# Patient Record
Sex: Female | Born: 2000 | Race: White | Hispanic: No | Marital: Single | Smoking: Never smoker
Health system: Southern US, Community
[De-identification: ages and names within clinical notes are randomized; demographics above are authoritative.]

## PROBLEM LIST (undated history)

## (undated) DIAGNOSIS — L940 Localized scleroderma [morphea]: Secondary | ICD-10-CM

## (undated) DIAGNOSIS — M069 Rheumatoid arthritis, unspecified: Secondary | ICD-10-CM

## (undated) HISTORY — PX: EYE MUSCLE SURGERY: SHX370

---
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
## 2020-08-15 DIAGNOSIS — M0579 Rheumatoid arthritis with rheumatoid factor of multiple sites without organ or systems involvement: Secondary | ICD-10-CM | POA: Diagnosis present

## 2020-10-04 ENCOUNTER — Other Ambulatory Visit: Payer: Self-pay

## 2020-10-04 ENCOUNTER — Encounter: Payer: Self-pay | Admitting: Intensive Care

## 2020-10-04 ENCOUNTER — Emergency Department
Admission: EM | Admit: 2020-10-04 | Discharge: 2020-10-04 | Disposition: A | Discharge status: Home / Self Care | Payer: BC Managed Care – PPO | Source: Home / Self Care | Attending: Emergency Medicine | Admitting: Emergency Medicine

## 2020-10-04 DIAGNOSIS — R1310 Dysphagia, unspecified: Secondary | ICD-10-CM | POA: Insufficient documentation

## 2020-10-04 DIAGNOSIS — T783XXA Angioneurotic edema, initial encounter: Secondary | ICD-10-CM | POA: Diagnosis not present

## 2020-10-04 DIAGNOSIS — R0789 Other chest pain: Secondary | ICD-10-CM | POA: Insufficient documentation

## 2020-10-04 DIAGNOSIS — L509 Urticaria, unspecified: Secondary | ICD-10-CM | POA: Insufficient documentation

## 2020-10-04 HISTORY — DX: Rheumatoid arthritis, unspecified: M06.9

## 2020-10-04 HISTORY — DX: Localized scleroderma (morphea): L94.0

## 2020-10-04 MED ORDER — SODIUM CHLORIDE 0.9 % IV BOLUS
1000.0000 mL | Freq: Once | INTRAVENOUS | Status: AC
  Administered 2020-10-04: 1000 mL via INTRAVENOUS

## 2020-10-04 MED ORDER — DIPHENHYDRAMINE HCL 50 MG/ML IJ SOLN
25.0000 mg | Freq: Once | INTRAMUSCULAR | Status: AC
  Administered 2020-10-04: 25 mg via INTRAVENOUS
  Filled 2020-10-04: qty 1

## 2020-10-04 MED ORDER — FAMOTIDINE IN NACL 20-0.9 MG/50ML-% IV SOLN
20.0000 mg | Freq: Once | INTRAVENOUS | Status: AC
  Administered 2020-10-04: 20 mg via INTRAVENOUS
  Filled 2020-10-04: qty 50

## 2020-10-04 MED ORDER — FAMOTIDINE 20 MG PO TABS: 20.0000 mg | ORAL_TABLET | Freq: Two times a day (BID) | ORAL | 0 refills | Status: AC

## 2020-10-04 MED ORDER — HYDROXYZINE PAMOATE 25 MG PO CAPS: 25.0000 mg | ORAL_CAPSULE | Freq: Three times a day (TID) | ORAL | 0 refills | Status: AC | PRN

## 2020-10-04 MED ORDER — METHYLPREDNISOLONE SODIUM SUCC 125 MG IJ SOLR
125.0000 mg | Freq: Once | INTRAMUSCULAR | Status: AC
  Administered 2020-10-04: 125 mg via INTRAVENOUS
  Filled 2020-10-04: qty 2

## 2020-10-04 NOTE — ED Notes (Signed)
DC instructions and medications reviewed by RN with pt and visitor, denied needs or concerns. AOx4, talking in full sentences with regular and unlabored breathing. Ambulated out of ED with steady gait

## 2020-10-04 NOTE — ED Notes (Signed)
Assumed care of pt at 2300. Pt reports rash is significantly improved from arrival, no longer having trouble swallowing.  Managing secretions well. Rash noted to posterior neck/hair line at this time. AO x4, talking in full sentences with regular and unlabored breathing.

## 2020-10-04 NOTE — ED Triage Notes (Signed)
Patient presents with rash and whelps from head to toes. Hands and feet swelling. Patient c/o chest tightness and some difficulty swallowing. Urgent care gave steroids yesterday. Patient reports symptoms worse today

## 2020-10-04 NOTE — ED Provider Notes (Signed)
[facility] Emergency Department Provider Note  ____________________________________________   Event Date/Time   First MD Initiated Contact with Patient 10/04/20 1823     (approximate)  I have reviewed the triage vital signs and the nursing notes.   HISTORY  Chief Complaint Allergic Reaction, Chest Pain, and Dysphagia  HPI Brandy Herring is a 20 y.o. female with the below medical history, presents herself to the ED for evaluation of ongoing allergic reaction.  She describes itchy rash and welts from head to toe.  She also notes some chest tightness a sense of difficulty swallowing.  She presented to a local urgent care center yesterday, and was given a course of oral steroids.  She describes no improvement in her symptoms today.  She notes continued rash as well as some joint tightness and swelling.  She has continued with the steroid as prescribed.  She denies any swelling of her lips, tongue, or throat.  She has been able to control oral secretions, and denies any syncope.     Past Medical History:  Diagnosis Date  . Morphea   . RA (rheumatoid arthritis) [facility])     Patient Active Problem List   Diagnosis Date Noted  . Generalized rash 10/05/2020  . Chest pain 10/05/2020  . SOB (shortness of breath) 10/05/2020  . Urticaria 10/05/2020  . Hives 10/05/2020  . Abdominal pain 10/05/2020  . Rheumatoid arthritis involving multiple sites with positive rheumatoid factor (HCC) 08/15/2020  . Mixed obsessional thoughts and acts 04/25/2019  . Mild episode of recurrent major depressive disorder (HCC) 04/25/2019  . Generalized anxiety disorder 04/25/2019  . Attention deficit hyperactivity disorder (ADHD), combined type 04/25/2019    Past Surgical History:  Procedure Laterality Date  . EYE MUSCLE SURGERY     For her strabismus.    Prior to Admission medications   Medication Sig Start Date End Date Taking? Authorizing Provider  famotidine (PEPCID) 20 MG tablet  Take 1 tablet (20 mg total) by mouth 2 (two) times daily for 10 days. 10/04/20 10/14/20 Yes Shailynn Fong, Charlesetta Ivory, PA-C  hydrOXYzine (VISTARIL) 25 MG capsule Take 1 capsule (25 mg total) by mouth 3 (three) times daily as needed for up to 10 days for itching. 10/04/20 10/14/20 Yes Kwan Shellhammer, Charlesetta Ivory, PA-C  hydroxychloroquine (PLAQUENIL) 200 MG tablet Take 1 tablet by mouth 2 (two) times daily. 11/18/17   [provider]  medroxyPROGESTERone (DEPO-PROVERA) 150 MG/ML injection Inject into the muscle.    [provider]  naproxen sodium (ALEVE) 220 MG tablet Take 1 tablet (220 mg total) by mouth daily as needed. 10/07/20   Darlin Priestly, MD    Allergies Elemental sulfur and Sulfa antibiotics  Family History  Problem Relation Age of Onset  . High blood pressure Mother   . High blood pressure Father   . High Cholesterol Father     Social History Social History   Tobacco Use  . Smoking status: Never Smoker  . Smokeless tobacco: Never Used  Substance Use Topics  . Alcohol use: Never  . Drug use: Never    Review of Systems  Constitutional: No fever/chills Eyes: No visual changes. ENT: No sore throat. Cardiovascular: Denies chest pain. Respiratory: Denies shortness of breath. Gastrointestinal: No abdominal pain.  No nausea, no vomiting.  No diarrhea.  No constipation. Genitourinary: Negative for dysuria. Musculoskeletal: Negative for back pain. Reports joint pain Skin: Positive for rash. Neurological: Negative for headaches, focal weakness or numbness. ____________________________________________   PHYSICAL EXAM:  VITAL  SIGNS: ED Triage Vitals  Enc Vitals Group     BP 10/04/20 1817 118/75     Pulse Rate 10/04/20 1817 (!) 152     Resp 10/04/20 1817 (!) 22     Temp 10/04/20 1817 99.1 F (37.3 C)     Temp Source 10/04/20 1817 Oral     SpO2 10/04/20 1817 99 %     Weight 10/04/20 1818 160 lb (72.6 kg)     Height 10/04/20 1818 5\' 3"  (1.6 m)     Head  Circumference --      Peak Flow --      Pain Score 10/04/20 1817 7     Pain Loc --      Pain Edu? --      Excl. in GC? --     Constitutional: Alert and oriented. Well appearing and in no acute distress. Eyes: Conjunctivae are normal. PERRL. EOMI. Head: Atraumatic. Nose: No congestion/rhinnorhea. Mouth/Throat: Mucous membranes are moist.  Oropharynx is non-erythematous.  No oral lesions appreciated.  Uvula is midline and tonsils are flat. Neck: No stridor.   Hematological/Lymphatic/Immunilogical: No cervical lymphadenopathy. Cardiovascular: Normal rate, regular rhythm. Grossly normal heart sounds.  Good peripheral circulation. Respiratory: Normal respiratory effort.  No retractions. Lungs CTAB. Gastrointestinal: Soft and nontender. No distention. No abdominal bruits. No CVA tenderness. Musculoskeletal: No lower extremity tenderness nor edema.  No joint effusions. Neurologic:  Normal speech and language. No gross focal neurologic deficits are appreciated. No gait instability. Skin:  Skin is warm, dry and intact.  Patient with multiple maculopapular erythematous lesions noted across the trunk and extremities. Psychiatric: Mood and affect are normal. Speech and behavior are normal. ____________________________________________   LABS (all labs ordered are listed, but only abnormal results are displayed)  Labs Reviewed - No data to display ____________________________________________  EKG   ____________________________________________  RADIOLOGY I, 10/06/20, personally viewed and evaluated these images (plain radiographs) as part of my medical decision making, as well as reviewing the written report by the radiologist.  ED MD interpretation:   Official radiology report(s): No results found.  ____________________________________________   PROCEDURES  Procedure(s) performed (including Critical Care):  Procedures  Solumedrol 125 mg IVP Diphenhydramine 50 mg  IVP Famotidine 20 mb IVPB NS 1000 ml IVP ____________________________________________   INITIAL IMPRESSION / ASSESSMENT AND PLAN / ED COURSE  As part of my medical decision making, I reviewed the following data within the electronic MEDICAL RECORD NUMBER History obtained from family and Notes from prior ED visits  DDX: allergic reaction, urticaria, anaphylaxis, contact dermatitis  Female patient ED evaluation of persistent pruritic rash concerning for contact dermatitis versus allergic reaction.  The exact trigger causes not clear, although the patient does report some skin sensitivity as well as some chemical irritant related to laundry detergent.  She was evaluated for symptoms at this time, and is reporting some improvement after IV medication administration of antihistamines and steroids.  Patient will remain in the ED for observation and fluid hydration.  Final disposition is transferred at this time to my attending provider, Lissa Hoard, MD. ____________________________________________   FINAL CLINICAL IMPRESSION(S) / ED DIAGNOSES  Final diagnoses:  Urticaria     ED Discharge Orders         Ordered    famotidine (PEPCID) 20 MG tablet  2 times daily        10/04/20 2015    hydrOXYzine (VISTARIL) 25 MG capsule  3 times daily PRN        10/04/20  2015          *Please note:  Mozell Haber was evaluated in Emergency Department on 10/12/2020 for the symptoms described in the history of present illness. She was evaluated in the context of the global COVID-19 pandemic, which necessitated consideration that the patient might be at risk for infection with the SARS-CoV-2 virus that causes COVID-19. Institutional protocols and algorithms that pertain to the evaluation of patients at risk for COVID-19 are in a state of rapid change based on information released by regulatory bodies including the CDC and federal and state organizations. These policies and algorithms were followed during the  patient's care in the ED.  Some ED evaluations and interventions may be delayed as a result of limited staffing during and the pandemic.*   Note:  This document was prepared using Dragon voice recognition software and may include unintentional dictation errors.    Lissa Hoard, PA-C 10/12/20 1917    Sharman Cheek, MD 10/13/20 [phone]

## 2020-10-04 NOTE — Discharge Instructions (Signed)
Take the previously prescribed steroid as directed. Take the prescription anti-histamine medicines as directed. Caution drowsiness with the hydroxyzine. Follow-up with your provider as needed. Return to the ED as needed.

## 2020-10-04 NOTE — ED Provider Notes (Signed)
Procedures     ----------------------------------------- 11:45 PM on 10/04/2020 -----------------------------------------  Patient reassessed.  Vital signs normal, chest tightness and sore throat feels much better, rash improved, reports feeling 90% better.  Throat looks normal, lungs clear to auscultation without wheezing.  Stable for discharge to continue her steroid taper and antihistamines.    Sharman Cheek, MD 10/04/20 [phone]

## 2020-10-04 NOTE — ED Notes (Signed)
Pt sts she is feeling better and hives are improved after medication. Pt sts her throat feels the same. Able to speak in full clear sentences, rr even and unlabored. Pt continues to have generalized rash.  Visitor at bedside.

## 2020-10-05 ENCOUNTER — Encounter: Payer: Self-pay | Admitting: Intensive Care

## 2020-10-05 ENCOUNTER — Inpatient Hospital Stay
Admission: EM | Admit: 2020-10-05 | Discharge: 2020-10-07 | DRG: 916 | Disposition: A | Discharge status: Home / Self Care | Payer: BC Managed Care – PPO | Attending: Hospitalist | Admitting: Hospitalist

## 2020-10-05 ENCOUNTER — Other Ambulatory Visit: Payer: Self-pay

## 2020-10-05 DIAGNOSIS — M0579 Rheumatoid arthritis with rheumatoid factor of multiple sites without organ or systems involvement: Secondary | ICD-10-CM | POA: Diagnosis present

## 2020-10-05 DIAGNOSIS — R21 Rash and other nonspecific skin eruption: Secondary | ICD-10-CM | POA: Diagnosis present

## 2020-10-05 DIAGNOSIS — R52 Pain, unspecified: Secondary | ICD-10-CM

## 2020-10-05 DIAGNOSIS — R7989 Other specified abnormal findings of blood chemistry: Secondary | ICD-10-CM | POA: Diagnosis present

## 2020-10-05 DIAGNOSIS — Z79899 Other long term (current) drug therapy: Secondary | ICD-10-CM

## 2020-10-05 DIAGNOSIS — L509 Urticaria, unspecified: Secondary | ICD-10-CM | POA: Diagnosis present

## 2020-10-05 DIAGNOSIS — R1084 Generalized abdominal pain: Secondary | ICD-10-CM

## 2020-10-05 DIAGNOSIS — R0602 Shortness of breath: Secondary | ICD-10-CM | POA: Diagnosis present

## 2020-10-05 DIAGNOSIS — R1013 Epigastric pain: Secondary | ICD-10-CM | POA: Diagnosis present

## 2020-10-05 DIAGNOSIS — Z882 Allergy status to sulfonamides status: Secondary | ICD-10-CM

## 2020-10-05 DIAGNOSIS — R079 Chest pain, unspecified: Secondary | ICD-10-CM

## 2020-10-05 DIAGNOSIS — T782XXA Anaphylactic shock, unspecified, initial encounter: Secondary | ICD-10-CM

## 2020-10-05 DIAGNOSIS — Z20822 Contact with and (suspected) exposure to covid-19: Secondary | ICD-10-CM | POA: Diagnosis present

## 2020-10-05 DIAGNOSIS — L940 Localized scleroderma [morphea]: Secondary | ICD-10-CM | POA: Diagnosis present

## 2020-10-05 DIAGNOSIS — T783XXA Angioneurotic edema, initial encounter: Principal | ICD-10-CM | POA: Diagnosis present

## 2020-10-05 DIAGNOSIS — R0789 Other chest pain: Secondary | ICD-10-CM | POA: Diagnosis present

## 2020-10-05 DIAGNOSIS — R109 Unspecified abdominal pain: Secondary | ICD-10-CM | POA: Diagnosis present

## 2020-10-05 LAB — CBC
HCT: 42 % (ref 36.0–46.0)
Hemoglobin: 13.9 g/dL (ref 12.0–15.0)
MCH: 29.3 pg (ref 26.0–34.0)
MCHC: 33.1 g/dL (ref 30.0–36.0)
MCV: 88.6 fL (ref 80.0–100.0)
Platelets: 303 10*3/uL (ref 150–400)
RBC: 4.74 MIL/uL (ref 3.87–5.11)
RDW: 13.1 % (ref 11.5–15.5)
WBC: 19.1 10*3/uL — ABNORMAL HIGH (ref 4.0–10.5)
nRBC: 0 % (ref 0.0–0.2)

## 2020-10-05 LAB — COMPREHENSIVE METABOLIC PANEL
ALT: 18 U/L (ref 0–44)
AST: 23 U/L (ref 15–41)
Albumin: 4.1 g/dL (ref 3.5–5.0)
Alkaline Phosphatase: 45 U/L (ref 38–126)
Anion gap: 10 (ref 5–15)
BUN: 14 mg/dL (ref 6–20)
CO2: 21 mmol/L — ABNORMAL LOW (ref 22–32)
Calcium: 9.1 mg/dL (ref 8.9–10.3)
Chloride: 110 mmol/L (ref 98–111)
Creatinine, Ser: 0.66 mg/dL (ref 0.44–1.00)
GFR, Estimated: >60 mL/min (ref >60)
Glucose, Bld: 127 mg/dL — ABNORMAL HIGH (ref 70–99)
Potassium: 3.9 mmol/L (ref 3.5–5.1)
Sodium: 141 mmol/L (ref 135–145)
Total Bilirubin: 1 mg/dL (ref 0.3–1.2)
Total Protein: 6.9 g/dL (ref 6.5–8.1)

## 2020-10-05 LAB — TROPONIN I (HIGH SENSITIVITY)
Troponin I (High Sensitivity): <2 ng/L (ref <18)
Troponin I (High Sensitivity): <2 ng/L (ref <18)
Troponin I (High Sensitivity): <2 ng/L (ref <18)

## 2020-10-05 LAB — TYPE AND SCREEN
ABO/RH(D): O POS
Antibody Screen: NEGATIVE

## 2020-10-05 LAB — D-DIMER, QUANTITATIVE: D-Dimer, Quant: 5.75 ug/mL-FEU — ABNORMAL HIGH (ref 0.00–0.50)

## 2020-10-05 LAB — CBC WITH DIFFERENTIAL/PLATELET
Abs Immature Granulocytes: 0.05 10*3/uL (ref 0.00–0.07)
Basophils Absolute: 0 10*3/uL (ref 0.0–0.1)
Basophils Relative: 0 %
Eosinophils Absolute: 0 10*3/uL (ref 0.0–0.5)
Eosinophils Relative: 0 %
HCT: 38.3 % (ref 36.0–46.0)
Hemoglobin: 13 g/dL (ref 12.0–15.0)
Immature Granulocytes: 0 %
Lymphocytes Relative: 4 %
Lymphs Abs: 0.6 10*3/uL — ABNORMAL LOW (ref 0.7–4.0)
MCH: 30 pg (ref 26.0–34.0)
MCHC: 33.9 g/dL (ref 30.0–36.0)
MCV: 88.2 fL (ref 80.0–100.0)
Monocytes Absolute: 0.3 10*3/uL (ref 0.1–1.0)
Monocytes Relative: 2 %
Neutro Abs: 12.4 10*3/uL — ABNORMAL HIGH (ref 1.7–7.7)
Neutrophils Relative %: 94 %
Platelets: 274 10*3/uL (ref 150–400)
RBC: 4.34 MIL/uL (ref 3.87–5.11)
RDW: 12.9 % (ref 11.5–15.5)
WBC: 13.3 10*3/uL — ABNORMAL HIGH (ref 4.0–10.5)
nRBC: 0 % (ref 0.0–0.2)

## 2020-10-05 LAB — MONONUCLEOSIS SCREEN: Mono Screen: NEGATIVE

## 2020-10-05 LAB — LIPASE, BLOOD: Lipase: 22 U/L (ref 11–51)

## 2020-10-05 LAB — T4, FREE: Free T4: 0.89 ng/dL (ref 0.61–1.12)

## 2020-10-05 LAB — TSH: TSH: 0.199 u[IU]/mL — ABNORMAL LOW (ref 0.350–4.500)

## 2020-10-05 MED ORDER — METHYLPREDNISOLONE SODIUM SUCC 125 MG IJ SOLR
125.0000 mg | Freq: Once | INTRAMUSCULAR | Status: AC
  Administered 2020-10-05: 125 mg via INTRAVENOUS
  Filled 2020-10-05: qty 2

## 2020-10-05 MED ORDER — DIPHENHYDRAMINE HCL 50 MG/ML IJ SOLN: 25.0000 mg | Freq: Three times a day (TID) | INTRAMUSCULAR | Status: DC

## 2020-10-05 MED ORDER — EPINEPHRINE 0.3 MG/0.3ML IJ SOAJ
0.3000 mg | Freq: Once | INTRAMUSCULAR | Status: AC
  Administered 2020-10-05: 0.3 mg via INTRAMUSCULAR
  Filled 2020-10-05: qty 0.3

## 2020-10-05 MED ORDER — ACETAMINOPHEN 325 MG PO TABS
650.0000 mg | ORAL_TABLET | Freq: Four times a day (QID) | ORAL | Status: DC | PRN
  Administered 2020-10-06: 650 mg via ORAL
  Filled 2020-10-05: qty 2

## 2020-10-05 MED ORDER — SODIUM CHLORIDE 0.9 % IV SOLN: INTRAVENOUS | Status: DC

## 2020-10-05 MED ORDER — IBUPROFEN 400 MG PO TABS
400.0000 mg | ORAL_TABLET | Freq: Once | ORAL | Status: AC
  Administered 2020-10-05: 400 mg via ORAL
  Filled 2020-10-05: qty 1

## 2020-10-05 MED ORDER — FAMOTIDINE IN NACL 20-0.9 MG/50ML-% IV SOLN
20.0000 mg | Freq: Once | INTRAVENOUS | Status: AC
  Administered 2020-10-05: 20 mg via INTRAVENOUS
  Filled 2020-10-05: qty 50

## 2020-10-05 MED ORDER — DIPHENHYDRAMINE HCL 50 MG/ML IJ SOLN
25.0000 mg | Freq: Three times a day (TID) | INTRAMUSCULAR | Status: DC
  Administered 2020-10-05 – 2020-10-06 (×2): 25 mg via INTRAVENOUS
  Filled 2020-10-05 (×2): qty 1

## 2020-10-05 MED ORDER — PANTOPRAZOLE SODIUM 40 MG IV SOLR
40.0000 mg | INTRAVENOUS | Status: DC
  Administered 2020-10-05 – 2020-10-06 (×2): 40 mg via INTRAVENOUS
  Filled 2020-10-05 (×2): qty 40

## 2020-10-05 MED ORDER — DIPHENHYDRAMINE HCL 50 MG/ML IJ SOLN
25.0000 mg | Freq: Once | INTRAMUSCULAR | Status: AC
  Administered 2020-10-05: 25 mg via INTRAVENOUS
  Filled 2020-10-05: qty 1

## 2020-10-05 MED ORDER — METHYLPREDNISOLONE SODIUM SUCC 125 MG IJ SOLR
80.0000 mg | Freq: Two times a day (BID) | INTRAMUSCULAR | Status: DC
  Administered 2020-10-05 – 2020-10-07 (×4): 80 mg via INTRAVENOUS
  Filled 2020-10-05 (×4): qty 2

## 2020-10-05 MED ORDER — HEPARIN SODIUM (PORCINE) 5000 UNIT/ML IJ SOLN
5000.0000 [IU] | Freq: Three times a day (TID) | INTRAMUSCULAR | Status: DC
  Administered 2020-10-05 – 2020-10-06 (×5): 5000 [IU] via SUBCUTANEOUS
  Filled 2020-10-05 (×5): qty 1

## 2020-10-05 MED ORDER — ONDANSETRON HCL 4 MG/2ML IJ SOLN
4.0000 mg | Freq: Once | INTRAMUSCULAR | Status: AC
  Administered 2020-10-05: 4 mg via INTRAVENOUS
  Filled 2020-10-05: qty 2

## 2020-10-05 MED ORDER — ACETAMINOPHEN 650 MG RE SUPP: 650.0000 mg | Freq: Four times a day (QID) | RECTAL | Status: DC | PRN

## 2020-10-05 NOTE — H&P (Addendum)
History and Physical    Brandy Herring ZSW:[id number] DOB: 2001-04-30 DOA: 10/05/2020  PCP: Charlton Amor, MD    Patient coming from:  Home   Chief Complaint:  Rash/chest pain/shortness of breath.   HPI: Brandy Herring is a 20 y.o. female seen in ed with complaints of rash since Wednesday.  Patient states the rash started it her legs and progressed to her torso and her arms.  Patient states that today morning when she woke up her face was swollen she had chest tightness and difficulty breathing.  Patient notes the only thing that is different is that she tried a new detergent on Wednesday.  The rash is diffuse and she has gone to urgent care then her primary care and she came to the ED yesterday was discharged and she is coming back to [place] because of the swelling of her face the shortness of breath chest tightness and worse from yesterday.  Patient denies any tobacco drugs or alcohol.  While in the emergency room she was given an EpiPen because of her shortness of breath and throat swelling and chest tightness.  Patient states that this is never happened to her before.  She reports that the chest pain was 6 out of 10 and felt like her lungs hurt to breathe.  Patient denies any pregnancy miscarriages.   Pt has past medical history of rheumatoid arthritis diagnosed earlier this year patient sees [facility].  Patient also has a history of morphea.  Patient takes Plaquenil, hydroxyzine, Zoloft for home meds.  ED Course:  Vitals:   10/05/20 1638 10/05/20 1645 10/05/20 1738 10/05/20 1740  BP: 113/69  124/86   Pulse: (!) 103 (!) 103 (!) 110 (!) 102  Resp: (!) 28 19 20    Temp:   99 F (37.2 C)   TempSrc:      SpO2: 99% 99% 100% 100%  Weight:      Height:      In the emergency room patient is alert awake oriented, father at bedside, tachycardic attributed to the epinephrine.  CMP is normal except for mild elevation in glucose of 127, CBC shows elevated white count of 19.1 which I  suspect is from her steroid shot yesterday.  Repeat CBC shows a white count of 13.3 otherwise within normal limits.  In the emergency room patient received Solu-Medrol 125 mg, ibuprofen 400 mg, Zofran, EpiPen 0.3 mg intramuscularly, Benadryl 25 mg and normal saline maintenance fluid.Mono screen is negative. D-Dimer is positive and with chest pain , tightness and sob we will obtain cta chest. We will also obtain lipase and ggt and pursue for imaign with usg or ct once results available.    Review of Systems:  Review of Systems  Constitutional: Negative.   HENT: Negative.   Eyes: Negative.   Respiratory: Positive for shortness of breath.   Cardiovascular: Positive for chest pain.  Gastrointestinal: Negative.   Genitourinary: Negative.   Musculoskeletal: Negative.   Skin: Positive for itching and rash.  Neurological: Negative.   Psychiatric/Behavioral: The patient is nervous/anxious.      Past Medical History:  Diagnosis Date  . Morphea   . RA (rheumatoid arthritis) (HCC)     Past Surgical History:  Procedure Laterality Date  . EYE MUSCLE SURGERY     For her strabismus.     reports that she has never smoked. She has never used smokeless tobacco. She reports that she does not drink alcohol and does not use drugs.  Allergies  Allergen Reactions  .  Elemental Sulfur   . Sulfa Antibiotics     Family History  Problem Relation Age of Onset  . High blood pressure Mother   . High blood pressure Father   . High Cholesterol Father     Prior to Admission medications   Medication Sig Start Date End Date Taking? Authorizing Provider  famotidine (PEPCID) 20 MG tablet Take 1 tablet (20 mg total) by mouth 2 (two) times daily for 10 days. 10/04/20 10/14/20  Menshew, Charlesetta Ivory, PA-C  hydrOXYzine (ATARAX/VISTARIL) 25 MG tablet Take 0.5 tablets (12.5 mg total) by mouth 3 (three) times daily as needed. 12/04/19   Darcel Smalling, MD  hydrOXYzine (VISTARIL) 25 MG capsule Take 1 capsule  (25 mg total) by mouth 3 (three) times daily as needed for up to 10 days for itching. 10/04/20 10/14/20  Menshew, Charlesetta Ivory, PA-C  sertraline (ZOLOFT) 100 MG tablet Take 2 tablets (200 mg total) by mouth daily. 12/04/19   Darcel Smalling, MD    Physical Exam: Vitals:   10/05/20 1638 10/05/20 1645 10/05/20 1738 10/05/20 1740  BP: 113/69  124/86   Pulse: (!) 103 (!) 103 (!) 110 (!) 102  Resp: (!) 28 19 20    Temp:   99 F (37.2 C)   TempSrc:      SpO2: 99% 99% 100% 100%  Weight:      Height:       Physical Exam Vitals and nursing note reviewed.  Constitutional:      General: She is not in acute distress.    Appearance: She is not ill-appearing or diaphoretic.  HENT:     Head: Normocephalic and atraumatic.     Right Ear: External ear normal.     Left Ear: External ear normal.     Nose: Nose normal.     Mouth/Throat:     Mouth: Mucous membranes are moist.  Eyes:     Extraocular Movements: Extraocular movements intact.     Pupils: Pupils are equal, round, and reactive to light.  Neck:     Vascular: No carotid bruit.  Cardiovascular:     Rate and Rhythm: Normal rate and regular rhythm.     Pulses: Normal pulses.     Heart sounds: Normal heart sounds.  Pulmonary:     Effort: Pulmonary effort is normal.     Breath sounds: Normal breath sounds.  Abdominal:     General: Bowel sounds are normal. There is distension.     Palpations: Abdomen is soft. There is no mass.     Tenderness: There is abdominal tenderness. There is no guarding.  Musculoskeletal:       Arms:     Right lower leg: No edema.     Left lower leg: No edema.  Skin:    General: Skin is warm.     Findings: Erythema and rash present. Rash is macular and urticarial. Rash is not crusting, nodular, papular, purpuric, pustular, scaling or vesicular.  Neurological:     General: No focal deficit present.     Mental Status: She is alert and oriented to person, place, and time.     Cranial Nerves: Cranial nerves are  intact.     Motor: Motor function is intact.     Coordination: Coordination is intact.     Gait: Gait is intact.  Psychiatric:        Mood and Affect: Mood normal.        Behavior: Behavior normal.   Rash on  exam seem like viral exanthem of urticarial -macular,nonblanching.  Labs on Admission: I have personally reviewed following labs and imaging studies  No results for input(s): CKTOTAL, CKMB, TROPONINI in the last 72 hours. Lab Results  Component Value Date   WBC 13.3 (H) 10/05/2020   HGB 13.0 10/05/2020   HCT 38.3 10/05/2020   MCV 88.2 10/05/2020   PLT 274 10/05/2020    Recent Labs  Lab 10/05/20 1503  NA 141  K 3.9  CL 110  CO2 21*  BUN 14  CREATININE 0.66  CALCIUM 9.1  PROT 6.9  BILITOT 1.0  ALKPHOS 45  ALT 18  AST 23  GLUCOSE 127*   No results found for: CHOL, HDL, LDLCALC, TRIG Lab Results  Component Value Date   DDIMER 5.75 (H) 10/05/2020   Invalid input(s): POCBNP  Urinalysis No results found for: COLORURINE, APPEARANCEUR, LABSPEC, PHURINE, GLUCOSEU, HGBUR, BILIRUBINUR, KETONESUR, PROTEINUR, UROBILINOGEN, NITRITE, LEUKOCYTESUR   COVID-19 Labs Recent Labs    10/05/20 1639  DDIMER 5.75*   No results found for: SARSCOV2NAA Radiological Exams on Admission: No results found.  EKG: Independently reviewed.  S.Tach 124.    Echocardiogram: None.   Assessment/Plan Principal Problem:   Urticaria Active Problems:   Generalized rash   Chest pain   SOB (shortness of breath)   Rheumatoid arthritis involving multiple sites with positive rheumatoid factor (HCC)   Abdominal pain   Urticaria/ rash/ angioedema: Rash is diffuse and macular, pruritic.d/d are varied , allergic reaction.angioedema, infectious related. Monospot is negative. We will admit pt for iv steroids and monitoring as she has had worsening of her symptoms.  Cont iv steroids/ h1/h2 blockers and if needed transfer for dermatology consult.   Chest pain/SOB: She developed chest pain  and sob since today morning and with her positive dimer  We will investigate with V/Q scan  As we have CT contrast shortage. Cycle troponin/ telemetry.   Rheumatoid arthritis: Pt is no on any meds currently and was on methotrexate in past.   Abdomina pain: On exam pt has BL upper quadrant and epigastric pain We will obtain lipase and ggt and start iv ppi. If needed ct abd and pelvis.     DVT prophylaxis:  Heparin  Code Status:  Full code    Family Communication:  Cosio,Melanie (Mother)  [phone] (Mobile)   Disposition Plan:  Home    Consults called:  None   Admission status: Inpatient.    Gertha Calkin MD [facility] [phone] How to contact the [facility] Attending or Consulting provider 7A - 7P or covering provider during after hours 7P -7A, for this patient.    1. Check the care team in [facility] and look for a) attending/consulting [facility] provider listed and b) the [facility] team listed 2. Log into [email or web address] and use [place]'s universal password to access. If you do not have the password, please contact the hospital operator. 3. Locate the [facility] provider you are looking for under [facility] and page to a number that you can be directly reached. 4. If you still have difficulty reaching the provider, please page the [facility] (Director on Call) for the Hospitalists listed on amion for assistance. [email or web address] Password [facility] 10/05/2020, 7:20 PM

## 2020-10-05 NOTE — ED Triage Notes (Signed)
Patient presents with rash from head to toe. Whelps present. Face swollen. C/o chest tightness and "lungs hurt when taking a breath." Seen for same yesterday

## 2020-10-05 NOTE — ED Provider Notes (Signed)
[facility] Emergency Department Provider Note   ____________________________________________    I have reviewed the triage vital signs and the nursing notes.   HISTORY  Chief Complaint Allergic Reaction     HPI Brandy Herring is a 20 y.o. female who presents with complaints of an allergic reaction.  Patient reports Wednesday night she used a different detergent than she typically does.  She wore close wash and that detergent.  On Thursday she developed an itchy diffuse rash.  She went to urgent care and was given oral medications and discharged.  However symptoms worsened so she went the next day to her primary care provider who increased her dose of prednisone.  She was seen in the emergency department last night after she felt a tightness in her throat  She returns today because although symptoms improved last night they seem to return today.  She reports her face is swollen, has itchy red rash diffusely.  Some chest tightness  Past Medical History:  Diagnosis Date  . Morphea   . RA (rheumatoid arthritis) [facility])     Patient Active Problem List   Diagnosis Date Noted  . Mixed obsessional thoughts and acts 04/25/2019  . Mild episode of recurrent major depressive disorder (HCC) 04/25/2019  . Generalized anxiety disorder 04/25/2019  . Attention deficit hyperactivity disorder (ADHD), combined type 04/25/2019    History reviewed. No pertinent surgical history.  Prior to Admission medications   Medication Sig Start Date End Date Taking? Authorizing Provider  famotidine (PEPCID) 20 MG tablet Take 1 tablet (20 mg total) by mouth 2 (two) times daily for 10 days. 10/04/20 10/14/20  Menshew, Charlesetta Ivory, PA-C  hydrOXYzine (ATARAX/VISTARIL) 25 MG tablet Take 0.5 tablets (12.5 mg total) by mouth 3 (three) times daily as needed. 12/04/19   Darcel Smalling, MD  hydrOXYzine (VISTARIL) 25 MG capsule Take 1 capsule (25 mg total) by mouth 3 (three) times daily as  needed for up to 10 days for itching. 10/04/20 10/14/20  Menshew, Charlesetta Ivory, PA-C  sertraline (ZOLOFT) 100 MG tablet Take 2 tablets (200 mg total) by mouth daily. 12/04/19   Darcel Smalling, MD     Allergies Elemental sulfur and Sulfa antibiotics  History reviewed. No pertinent family history.  Social History Social History   Tobacco Use  . Smoking status: Never Smoker  . Smokeless tobacco: Never Used  Substance Use Topics  . Alcohol use: Never  . Drug use: Never    Review of Systems  Constitutional: No fever/chills Eyes: No visual changes.  ENT: No throat swelling Cardiovascular: Denies chest pain. Respiratory: Denies shortness of breath. Gastrointestinal: No abdominal pain.  No nausea, no vomiting.   Genitourinary: Negative for dysuria. Musculoskeletal: Negative for back pain. Skin: Negative for rash. Neurological: Negative for headache   ____________________________________________   PHYSICAL EXAM:  VITAL SIGNS: ED Triage Vitals  Enc Vitals Group     BP 10/05/20 1110 (!) 117/93     Pulse Rate 10/05/20 1110 (!) 130     Resp 10/05/20 1110 (!) 22     Temp 10/05/20 1110 99.1 F (37.3 C)     Temp Source 10/05/20 1110 Oral     SpO2 10/05/20 1110 96 %     Weight 10/05/20 1117 72.6 kg (160 lb)     Height 10/05/20 1117 1.6 m (5\' 3" )     Head Circumference --      Peak Flow --      Pain Score 10/05/20  1117 8     Pain Loc --      Pain Edu? --      Excl. in GC? --     Constitutional: Alert and oriented.  Eyes: Conjunctivae are normal.  Head: Atraumatic. Nose: No congestion/rhinnorhea.  Mild facial swelling noted Mouth/Throat: Mucous membranes are moist.  No uvular swelling, pharynx normal Neck:  Painless ROM, no stridor Cardiovascular: Normal rate, regular rhythm. Grossly normal heart sounds.  Good peripheral circulation. Respiratory: Normal respiratory effort.  No retractions. Lungs CTAB.  No wheezing on exam Gastrointestinal: Soft and nontender. No  distention.  No CVA tenderness.  Musculoskeletal: No joint swelling warm and well perfused Neurologic:  Normal speech and language. No gross focal neurologic deficits are appreciated.  Skin:  Skin is warm, dry and intact.  Diffuse urticarial rash Psychiatric: Mood and affect are normal. Speech and behavior are normal.  ____________________________________________   LABS (all labs ordered are listed, but only abnormal results are displayed)  Labs Reviewed  SARS CORONAVIRUS 2 (TAT 6-24 HRS)  CBC  COMPREHENSIVE METABOLIC PANEL   ____________________________________________  EKG   ____________________________________________  RADIOLOGY   ____________________________________________   PROCEDURES  Procedure(s) performed: No  Procedures   Critical Care performed: yes  CRITICAL CARE Performed by: Jene Every   Total critical care time: 30 minutes  Critical care time was exclusive of separately billable procedures and treating other patients.  Critical care was necessary to treat or prevent imminent or life-threatening deterioration.  Critical care was time spent personally by me on the following activities: development of treatment plan with patient and/or surrogate as well as nursing, discussions with consultants, evaluation of patient's response to treatment, examination of patient, obtaining history from patient or surrogate, ordering and performing treatments and interventions, ordering and review of laboratory studies, ordering and review of radiographic studies, pulse oximetry and re-evaluation of patient's condition.  ____________________________________________   INITIAL IMPRESSION / ASSESSMENT AND PLAN / ED COURSE  Pertinent labs & imaging results that were available during my care of the patient were reviewed by me and considered in my medical decision making (see chart for details).  Patient presents with symptoms consistent with allergic reaction.  She  has diffuse hives.  She has swelling to the face/angioedema and concernedly has complaints of chest tightness.  Brought immediately to a room, IM epi 0.3 mg administered given concern for anaphylaxis  Additionally IV Solu-Medrol, IV Pepcid, IV Benadryl given    ----------------------------------------- 2:00 PM on 10/05/2020 ----------------------------------------- Patient's have markedly improved, rash has almost resolved, swelling has gone down  ----------------------------------------- 2:47 PM on 10/05/2020 ----------------------------------------- Patient's rash is returning, she reports some tightness in her chest that she takes a deep breath.  She remains tachycardic.  I will consult the hospitalist for admission      ____________________________________________   FINAL CLINICAL IMPRESSION(S) / ED DIAGNOSES  Final diagnoses:  Anaphylaxis, initial encounter        Note:  This document was prepared using Dragon voice recognition software and may include unintentional dictation errors.   Jene Every, MD 10/05/20 [phone]

## 2020-10-06 ENCOUNTER — Observation Stay: Payer: BC Managed Care – PPO

## 2020-10-06 DIAGNOSIS — M0579 Rheumatoid arthritis with rheumatoid factor of multiple sites without organ or systems involvement: Secondary | ICD-10-CM | POA: Diagnosis present

## 2020-10-06 DIAGNOSIS — Z882 Allergy status to sulfonamides status: Secondary | ICD-10-CM | POA: Diagnosis not present

## 2020-10-06 DIAGNOSIS — R0789 Other chest pain: Secondary | ICD-10-CM | POA: Diagnosis present

## 2020-10-06 DIAGNOSIS — L509 Urticaria, unspecified: Secondary | ICD-10-CM | POA: Diagnosis present

## 2020-10-06 DIAGNOSIS — R079 Chest pain, unspecified: Secondary | ICD-10-CM | POA: Diagnosis not present

## 2020-10-06 DIAGNOSIS — R1013 Epigastric pain: Secondary | ICD-10-CM | POA: Diagnosis present

## 2020-10-06 DIAGNOSIS — L940 Localized scleroderma [morphea]: Secondary | ICD-10-CM | POA: Diagnosis present

## 2020-10-06 DIAGNOSIS — R7989 Other specified abnormal findings of blood chemistry: Secondary | ICD-10-CM | POA: Diagnosis present

## 2020-10-06 DIAGNOSIS — T783XXA Angioneurotic edema, initial encounter: Secondary | ICD-10-CM | POA: Diagnosis present

## 2020-10-06 DIAGNOSIS — R0602 Shortness of breath: Secondary | ICD-10-CM | POA: Diagnosis not present

## 2020-10-06 DIAGNOSIS — Z79899 Other long term (current) drug therapy: Secondary | ICD-10-CM | POA: Diagnosis not present

## 2020-10-06 DIAGNOSIS — Z20822 Contact with and (suspected) exposure to covid-19: Secondary | ICD-10-CM | POA: Diagnosis present

## 2020-10-06 LAB — COMPREHENSIVE METABOLIC PANEL
ALT: 13 U/L (ref 0–44)
AST: 15 U/L (ref 15–41)
Albumin: 3.4 g/dL — ABNORMAL LOW (ref 3.5–5.0)
Alkaline Phosphatase: 35 U/L — ABNORMAL LOW (ref 38–126)
Anion gap: 7 (ref 5–15)
BUN: 15 mg/dL (ref 6–20)
CO2: 24 mmol/L (ref 22–32)
Calcium: 8.4 mg/dL — ABNORMAL LOW (ref 8.9–10.3)
Chloride: 110 mmol/L (ref 98–111)
Creatinine, Ser: 0.67 mg/dL (ref 0.44–1.00)
GFR, Estimated: >60 mL/min (ref >60)
Glucose, Bld: 135 mg/dL — ABNORMAL HIGH (ref 70–99)
Potassium: 4.1 mmol/L (ref 3.5–5.1)
Sodium: 141 mmol/L (ref 135–145)
Total Bilirubin: 0.6 mg/dL (ref 0.3–1.2)
Total Protein: 6 g/dL — ABNORMAL LOW (ref 6.5–8.1)

## 2020-10-06 LAB — CBC
HCT: 36.9 % (ref 36.0–46.0)
Hemoglobin: 12.3 g/dL (ref 12.0–15.0)
MCH: 29.4 pg (ref 26.0–34.0)
MCHC: 33.3 g/dL (ref 30.0–36.0)
MCV: 88.3 fL (ref 80.0–100.0)
Platelets: 248 10*3/uL (ref 150–400)
RBC: 4.18 MIL/uL (ref 3.87–5.11)
RDW: 13 % (ref 11.5–15.5)
WBC: 12 10*3/uL — ABNORMAL HIGH (ref 4.0–10.5)
nRBC: 0 % (ref 0.0–0.2)

## 2020-10-06 LAB — URINE DRUG SCREEN, QUALITATIVE (ARMC ONLY)
Amphetamines, Ur Screen: NOT DETECTED
Barbiturates, Ur Screen: NOT DETECTED
Benzodiazepine, Ur Scrn: NOT DETECTED
Cannabinoid 50 Ng, Ur ~~LOC~~: NOT DETECTED
Cocaine Metabolite,Ur ~~LOC~~: NOT DETECTED
MDMA (Ecstasy)Ur Screen: NOT DETECTED
Methadone Scn, Ur: NOT DETECTED
Opiate, Ur Screen: NOT DETECTED
Phencyclidine (PCP) Ur S: NOT DETECTED
Tricyclic, Ur Screen: POSITIVE — AB

## 2020-10-06 LAB — SARS CORONAVIRUS 2 (TAT 6-24 HRS): SARS Coronavirus 2: NEGATIVE

## 2020-10-06 LAB — HIV ANTIBODY (ROUTINE TESTING W REFLEX): HIV Screen 4th Generation wRfx: NONREACTIVE

## 2020-10-06 LAB — HEMOGLOBIN A1C
Hgb A1c MFr Bld: 5.5 % (ref 4.8–5.6)
Mean Plasma Glucose: 111.15 mg/dL

## 2020-10-06 LAB — GAMMA GT: GGT: 14 U/L (ref 7–50)

## 2020-10-06 LAB — PREGNANCY, URINE: Preg Test, Ur: NEGATIVE

## 2020-10-06 MED ORDER — ONDANSETRON HCL 4 MG/2ML IJ SOLN
4.0000 mg | Freq: Four times a day (QID) | INTRAMUSCULAR | Status: DC | PRN
  Administered 2020-10-06: 4 mg via INTRAVENOUS
  Filled 2020-10-06: qty 2

## 2020-10-06 MED ORDER — ONDANSETRON 4 MG PO TBDP
4.0000 mg | ORAL_TABLET | Freq: Three times a day (TID) | ORAL | Status: DC | PRN
  Filled 2020-10-06: qty 1

## 2020-10-06 MED ORDER — DOCUSATE SODIUM 100 MG PO CAPS: 100.0000 mg | ORAL_CAPSULE | Freq: Two times a day (BID) | ORAL | Status: DC | PRN

## 2020-10-06 MED ORDER — HYDROXYCHLOROQUINE SULFATE 200 MG PO TABS
200.0000 mg | ORAL_TABLET | Freq: Two times a day (BID) | ORAL | Status: DC
  Administered 2020-10-06 – 2020-10-07 (×3): 200 mg via ORAL
  Filled 2020-10-06 (×3): qty 1

## 2020-10-06 MED ORDER — HYDROCODONE-ACETAMINOPHEN 5-325 MG PO TABS
1.0000 | ORAL_TABLET | Freq: Four times a day (QID) | ORAL | Status: DC | PRN
Start: 2020-10-06 — End: 2020-10-07
  Administered 2020-10-06: 1 via ORAL
  Filled 2020-10-06: qty 1

## 2020-10-06 MED ORDER — TECHNETIUM TO 99M ALBUMIN AGGREGATED
4.0000 | Freq: Once | INTRAVENOUS | Status: AC | PRN
  Administered 2020-10-06: 3.57 via INTRAVENOUS

## 2020-10-06 MED ORDER — DIPHENHYDRAMINE HCL 25 MG PO CAPS
25.0000 mg | ORAL_CAPSULE | Freq: Four times a day (QID) | ORAL | Status: DC | PRN
  Administered 2020-10-06 – 2020-10-07 (×2): 25 mg via ORAL
  Filled 2020-10-06 (×2): qty 1

## 2020-10-06 MED ORDER — ALUM & MAG HYDROXIDE-SIMETH 200-200-20 MG/5ML PO SUSP
15.0000 mL | Freq: Four times a day (QID) | ORAL | Status: DC | PRN
  Administered 2020-10-06: 15 mL via ORAL
  Filled 2020-10-06: qty 30

## 2020-10-06 MED ORDER — KETOROLAC TROMETHAMINE 15 MG/ML IJ SOLN
15.0000 mg | Freq: Three times a day (TID) | INTRAMUSCULAR | Status: DC
  Administered 2020-10-06: 15 mg via INTRAVENOUS
  Filled 2020-10-06: qty 1

## 2020-10-06 MED ORDER — ENOXAPARIN SODIUM 40 MG/0.4ML IJ SOSY
40.0000 mg | PREFILLED_SYRINGE | INTRAMUSCULAR | Status: DC
  Administered 2020-10-07: 40 mg via SUBCUTANEOUS
  Filled 2020-10-06: qty 0.4

## 2020-10-06 MED ORDER — KETOROLAC TROMETHAMINE 15 MG/ML IJ SOLN
15.0000 mg | Freq: Three times a day (TID) | INTRAMUSCULAR | Status: AC | PRN
  Administered 2020-10-06: 15 mg via INTRAVENOUS
  Filled 2020-10-06: qty 1

## 2020-10-06 MED ORDER — LIDOCAINE VISCOUS HCL 2 % MT SOLN
15.0000 mL | Freq: Once | OROMUCOSAL | Status: AC
  Administered 2020-10-06: 15 mL via ORAL
  Filled 2020-10-06: qty 15

## 2020-10-06 MED ORDER — BISACODYL 10 MG RE SUPP
10.0000 mg | Freq: Once | RECTAL | Status: AC
  Administered 2020-10-06: 10 mg via RECTAL
  Filled 2020-10-06: qty 1

## 2020-10-06 MED ORDER — ALUM & MAG HYDROXIDE-SIMETH 200-200-20 MG/5ML PO SUSP
30.0000 mL | Freq: Once | ORAL | Status: AC
  Administered 2020-10-06: 30 mL via ORAL
  Filled 2020-10-06: qty 30

## 2020-10-06 MED ORDER — POLYETHYLENE GLYCOL 3350 17 G PO PACK
17.0000 g | PACK | Freq: Two times a day (BID) | ORAL | Status: DC | PRN
  Administered 2020-10-06 – 2020-10-07 (×2): 17 g via ORAL
  Filled 2020-10-06 (×2): qty 1

## 2020-10-06 MED ORDER — CALCIUM CARBONATE ANTACID 500 MG PO CHEW
1.0000 | CHEWABLE_TABLET | Freq: Three times a day (TID) | ORAL | Status: DC | PRN
  Administered 2020-10-06: 200 mg via ORAL
  Filled 2020-10-06: qty 1

## 2020-10-06 MED ORDER — DOCUSATE SODIUM 100 MG PO CAPS
100.0000 mg | ORAL_CAPSULE | Freq: Two times a day (BID) | ORAL | Status: DC | PRN
  Administered 2020-10-06: 100 mg via ORAL
  Filled 2020-10-06: qty 1

## 2020-10-06 MED ORDER — POLYETHYLENE GLYCOL 3350 17 G PO PACK: 17.0000 g | PACK | Freq: Two times a day (BID) | ORAL | Status: DC | PRN

## 2020-10-06 NOTE — Progress Notes (Signed)
Pt c/o 7/10 pain in chest area w/ some SOB. Demeanor calm; breathing even and unlabored. VS stable. Pt accepted offer of Tylenol. RN sent secure chat to Dr. Anna Genre to notify of pt condition and to determine if another course of tx warranted. Provider in agreement w/admin of Tylenol. However, if not effective, she may place order for morphine. RN will continue to monitor for efficacy.

## 2020-10-06 NOTE — Plan of Care (Signed)
  Problem: Education: Goal: Knowledge of General Education information will improve Description: Including pain rating scale, medication(s)/side effects and non-pharmacologic comfort measures Outcome: Progressing   Problem: Clinical Measurements: Goal: Ability to maintain clinical measurements within normal limits will improve Outcome: Progressing Goal: Will remain free from infection Outcome: Progressing Goal: Diagnostic test results will improve Outcome: Progressing Goal: Respiratory complications will improve Outcome: Progressing Goal: Cardiovascular complication will be avoided Outcome: Progressing   Problem: Nutrition: Goal: Adequate nutrition will be maintained Outcome: Progressing   Problem: Coping: Goal: Level of anxiety will decrease Outcome: Progressing   Problem: Pain Managment: Goal: General experience of comfort will improve Outcome: Progressing   Problem: Safety: Goal: Ability to remain free from injury will improve Outcome: Progressing   Problem: Skin Integrity: Goal: Risk for impaired skin integrity will decrease Outcome: Progressing   

## 2020-10-06 NOTE — Progress Notes (Signed)
PROGRESS NOTE    Brandy Herring  KDX:[id number] DOB: [birth year] DOA: 10/05/2020 PCP: Charlton Amor, MD  [id number]-[id number]   Assessment & Plan:   Principal Problem:   Urticaria Active Problems:   Rheumatoid arthritis involving multiple sites with positive rheumatoid factor (HCC)   Generalized rash   Chest pain   SOB (shortness of breath)   Abdominal pain   Brandy Herring is a 20 y.o. female seen in ed with complaints of rash since Wednesday.  Patient states the rash started it her legs and progressed to her torso and her arms.  Patient states that today morning when she woke up her face was swollen she had chest tightness and difficulty breathing.  Patient notes the only thing that is different is that she tried a new detergent on Wednesday.  The rash is diffuse and she has gone to urgent care then her primary care and she came to the ED yesterday was discharged and she is coming back to [place] because of the swelling of her face the shortness of breath chest tightness and worse from yesterday.  Patient denies any tobacco drugs or alcohol.  While in the emergency room she was given an EpiPen because of her shortness of breath and throat swelling and chest tightness.  Patient states that this is never happened to her before.  She reports that the chest pain was 6 out of 10 and felt like her lungs hurt to breathe.    In the emergency room patient received Solu-Medrol 125 mg, ibuprofen 400 mg, Zofran, EpiPen 0.3 mg intramuscularly, Benadryl 25 mg and normal saline maintenance fluid.   Urticaria/ rash/ angioedema Rash is diffuse and macular, pruritic. Monospot is negative. --Rash improved after presentation. Plan: --cont IV solumedrol --oral Benadryl PRN  Chest pain/SOB: --elevated D-dimer.  Trop neg.   --Pt complained of persistent chest pain not improved with tylenol or IV toradol Plan: --pulm perfusion scan today, neg for PE --Norco PRN for now  Rheumatoid arthritis --follows with Dr.  Gerrie Nordmann as outpatient --cont home Plaquenil --messaged Dr. Allena Katz   Abdomina pain: --on presentation, pt has BL upper quadrant and epigastric pain --started on IV PPI --cont IV PPI for now   DVT prophylaxis: Lovenox SQ Code Status: Full code  Family Communication: husband updated at bedside today Level of care: Med-Surg Dispo:   The patient is from: home Anticipated d/c is to: home Anticipated d/c date is: tomorrow Patient currently is not medically ready to d/c due to: unexplained chest pain and DOE   Subjective and Interval History:  Pt complained of significant chest pain, especially when taking in deep breaths.  Also had DOE.  Perfusion scan neg for PE.  Rash improved.  Facial swelling improved.   Objective: Vitals:   10/06/20 0806 10/06/20 0931 10/06/20 1558 10/06/20 1933  BP: (!) 103/52 (!) 95/54 (!) 98/50 110/60  Pulse: 78 89 81 96  Resp: 16 16 16 18   Temp: 98.7 F (37.1 C)  98.6 F (37 C) 98.7 F (37.1 C)  TempSrc:    Oral  SpO2: 99% 97% 97% 97%  Weight:      Height:        Intake/Output Summary (Last 24 hours) at 10/06/2020 2215 Last data filed at 10/06/2020 1108 Gross per 24 hour  Intake 1275.24 ml  Output --  Net 1275.24 ml   Filed Weights   10/05/20 1117  Weight: 72.6 kg    Examination:   Constitutional: NAD, AAOx3 HEENT: conjunctivae and lids normal,  EOMI CV: No cyanosis.   RESP: normal respiratory effort, on RA Extremities: No effusions, edema in BLE SKIN: warm, dry.  Macular rash all over the body Neuro: II - XII grossly intact.   Psych: Normal mood and affect.  Appropriate judgement and reason  Prior to presentation    10/06/20       Data Reviewed: I have personally reviewed following labs and imaging studies  CBC: Recent Labs  Lab 10/05/20 1503 10/05/20 1639 10/06/20 0554  WBC 19.1* 13.3* 12.0*  NEUTROABS  --  12.4*  --   HGB 13.9 13.0 12.3  HCT 42.0 38.3 36.9  MCV 88.6 88.2 88.3  PLT 303 274 248   Basic  Metabolic Panel: Recent Labs  Lab 10/05/20 1503 10/06/20 0554  NA 141 141  K 3.9 4.1  CL 110 110  CO2 21* 24  GLUCOSE 127* 135*  BUN 14 15  CREATININE 0.66 0.67  CALCIUM 9.1 8.4*   GFR: Estimated Creatinine Clearance: 108 mL/min (by C-G formula based on SCr of 0.67 mg/dL). Liver Function Tests: Recent Labs  Lab 10/05/20 1503 10/06/20 0554  AST 23 15  ALT 18 13  ALKPHOS 45 35*  BILITOT 1.0 0.6  PROT 6.9 6.0*  ALBUMIN 4.1 3.4*   Recent Labs  Lab 10/05/20 1659  LIPASE 22   No results for input(s): AMMONIA in the last 168 hours. Coagulation Profile: No results for input(s): INR, PROTIME in the last 168 hours. Cardiac Enzymes: No results for input(s): CKTOTAL, CKMB, CKMBINDEX, TROPONINI in the last 168 hours. BNP (last 3 results) No results for input(s): PROBNP in the last 8760 hours. HbA1C: Recent Labs    10/05/20 1639  HGBA1C 5.5   CBG: No results for input(s): GLUCAP in the last 168 hours. Lipid Profile: No results for input(s): CHOL, HDL, LDLCALC, TRIG, CHOLHDL, LDLDIRECT in the last 72 hours. Thyroid Function Tests: Recent Labs    10/05/20 1639  TSH 0.199*  FREET4 0.89   Anemia Panel: No results for input(s): VITAMINB12, FOLATE, FERRITIN, TIBC, IRON, RETICCTPCT in the last 72 hours. Sepsis Labs: No results for input(s): PROCALCITON, LATICACIDVEN in the last 168 hours.  Recent Results (from the past 240 hour(s))  SARS CORONAVIRUS 2 (TAT 6-24 HRS) Nasopharyngeal Nasopharyngeal Swab     Status: None   Collection Time: 10/05/20  3:48 PM   Specimen: Nasopharyngeal Swab  Result Value Ref Range Status   SARS Coronavirus 2 NEGATIVE NEGATIVE Final    Comment: (NOTE) SARS-CoV-2 target nucleic acids are NOT DETECTED.  The SARS-CoV-2 RNA is generally detectable in upper and lower respiratory specimens during the acute phase of infection. Negative results do not preclude SARS-CoV-2 infection, do not rule out co-infections with other pathogens, and should  not be used as the sole basis for treatment or other patient management decisions. Negative results must be combined with clinical observations, patient history, and epidemiological information. The expected result is Negative.  Fact Sheet for Patients: HairSlick.no  Fact Sheet for Healthcare Providers: quierodirigir.com  This test is not yet approved or cleared by the [place] FDA and  has been authorized for detection and/or diagnosis of SARS-CoV-2 by FDA under an Emergency Use Authorization (EUA). This EUA will remain  in effect (meaning this test can be used) for the duration of the COVID-19 declaration under Se ction 564(b)(1) of the Act, 21 U.S.C. section 360bbb-3(b)(1), unless the authorization is terminated or revoked sooner.  Performed at [facility], [address], [place], [place] [address]  Radiology Studies: DG Chest 2 View  Result Date: 10/06/2020 CLINICAL DATA:  Needed for nuclear med test EXAM: CHEST - 2 VIEW COMPARISON:  Chest radiograph or [id number] FINDINGS: The cardiomediastinal contours are within normal limits. The lungs are clear. No pneumothorax or pleural effusion. No acute finding in the visualized skeleton. IMPRESSION: No acute cardiopulmonary process. Electronically Signed   By: Emmaline Kluver M.D.   On: 10/06/2020 11:44   NM Pulmonary Perfusion  Result Date: 10/06/2020 CLINICAL DATA:  Shortness of breath. Elevated D-dimer. History of allergic reaction. EXAM: NUCLEAR MEDICINE PERFUSION LUNG SCAN TECHNIQUE: Perfusion images were obtained in multiple projections after intravenous injection of radiopharmaceutical. Ventilation scans intentionally deferred if perfusion scan and chest x-ray adequate for interpretation during COVID 19 epidemic. RADIOPHARMACEUTICALS:  3.6 mCi Tc-28m MAA IV COMPARISON:  Chest x-ray 10/06/2020 FINDINGS: There is normal bilateral perfusion. No evidence for  wedge-shaped, pleural-based defect to indicate pulmonary embolus. IMPRESSION: Normal perfusion. Electronically Signed   By: Norva Pavlov M.D.   On: 10/06/2020 13:11     Scheduled Meds: . heparin  5,000 Units Subcutaneous Q8H  . hydroxychloroquine  200 mg Oral BID  . methylPREDNISolone (SOLU-MEDROL) injection  80 mg Intravenous Q12H  . pantoprazole (PROTONIX) IV  40 mg Intravenous Q24H   Continuous Infusions:   LOS: 0 days     Darlin Priestly, MD [facility] If 7PM-7AM, please contact night-coverage 10/06/2020, 10:15 PM

## 2020-10-07 LAB — BASIC METABOLIC PANEL
Anion gap: 7 (ref 5–15)
BUN: 16 mg/dL (ref 6–20)
CO2: 25 mmol/L (ref 22–32)
Calcium: 8.3 mg/dL — ABNORMAL LOW (ref 8.9–10.3)
Chloride: 108 mmol/L (ref 98–111)
Creatinine, Ser: 0.54 mg/dL (ref 0.44–1.00)
GFR, Estimated: >60 mL/min (ref >60)
Glucose, Bld: 129 mg/dL — ABNORMAL HIGH (ref 70–99)
Potassium: 4.1 mmol/L (ref 3.5–5.1)
Sodium: 140 mmol/L (ref 135–145)

## 2020-10-07 LAB — CBC
HCT: 36.4 % (ref 36.0–46.0)
Hemoglobin: 12.1 g/dL (ref 12.0–15.0)
MCH: 29 pg (ref 26.0–34.0)
MCHC: 33.2 g/dL (ref 30.0–36.0)
MCV: 87.3 fL (ref 80.0–100.0)
Platelets: 257 10*3/uL (ref 150–400)
RBC: 4.17 MIL/uL (ref 3.87–5.11)
RDW: 12.6 % (ref 11.5–15.5)
WBC: 13.5 10*3/uL — ABNORMAL HIGH (ref 4.0–10.5)
nRBC: 0 % (ref 0.0–0.2)

## 2020-10-07 LAB — MAGNESIUM: Magnesium: 2.3 mg/dL (ref 1.7–2.4)

## 2020-10-07 MED ORDER — NAPROXEN SODIUM 220 MG PO TABS: 220.0000 mg | ORAL_TABLET | Freq: Every day | ORAL | Status: AC | PRN

## 2020-10-07 MED ORDER — PREDNISONE 20 MG PO TABS: 40.0000 mg | ORAL_TABLET | Freq: Every day | ORAL | 0 refills | Status: AC

## 2020-10-07 NOTE — Plan of Care (Signed)
Patient discharged home per MD orders at this time.All discharge instructions,education and medications reviewed with patient and family.patient expressed understanding and will comply with dc instructions.Follow up appointments also communicated to patient. no verbal c/o or any ssx of distress.Patient transported home by dad in a private car.

## 2020-10-07 NOTE — Discharge Summary (Signed)
Physician Discharge Summary   Brandy Herring  female DOB: May 09, 2001  OVF:[id number]  PCP: Charlton Amor, MD  Admit date: 10/05/2020 Discharge date: 10/07/2020  Admitted From: home Disposition:  home Husband updated at bedside prior to discharge.  CODE STATUS: Full code  Discharge Instructions    Discharge instructions   Complete by: As directed    You have severe allergic reaction with unclear trigger.  Rash has improved with IV solumedrol.  Since you want to go home, I am sending you home on 4 days of prednisone 40 mg daily, starting today.    Please follow up with your outpatient Rheumatologist Dr. Allena Katz.   Dr. Darlin Priestly [facility] Course:  For full details, please see H&P, progress notes, consult notes and ancillary notes.  Briefly,  Brandy Reeceis a 20 y.o.femaleseen in ed with complaints ofrash since Wednesday (2 days prior to presentation).  Patient stated the rash started on her legs andprogressed to her torso and her arms. Patient stated that morning of presentation when she woke up her face was swollen she had chest tightness and difficulty breathing. Patient noted the only thing that was different was that she tried a new detergent on Wednesday.  Patient denies any tobacco drugs or alcohol. Patient states that this is never happened to her before. She reports that the chest pain was 6 out of 10 and felt like her lungs hurt to breathe.   In the emergency room patient received Solu-Medrol 125 mg, ibuprofen 400 mg, Zofran, EpiPen 0.3 mg intramuscularly, Benadryl 25 mg and normal saline maintenance fluid.   Urticaria/ rash/ angioedema Rash is diffuse and macular, pruritic. Monospot is negative. --Rash improved after presentation.  I had messaged pt's outpatient Rheumatologist Dr. Allena Katz for further advise, but didn't hear back.  Pt asked to go home on the day of discharge, so pt was discharged on 4 more days of prednisone 40 mg daily.  Chest  pain/SOB: --elevated D-dimer.  Trop neg. --Pt complained of persistent chest pain not improved with tylenol or IV toradol, so was prescribed Norco PRN. --pulm perfusion scan neg for PE.  Chest pain did resolve prior to discharge.  Rheumatoid arthritis --follows with Dr. Gerrie Nordmann as outpatient --cont home Plaquenil  Abdomina pain: --on presentation, pt has BL upper quadrant and epigastric pain --started on IV PPI, which was d/c'ed prior to discharge. Abdominal pain resolved prior to discharge.   Discharge Diagnoses:  Principal Problem:   Urticaria Active Problems:   Rheumatoid arthritis involving multiple sites with positive rheumatoid factor (HCC)   Generalized rash   Chest pain   SOB (shortness of breath)   Abdominal pain   30 Day Unplanned Readmission Risk Score   Flowsheet Row ED to Hosp-Admission (Current) from 10/05/2020 in [facility] (1A)  30 Day Unplanned Readmission Risk Score (%) 8.78 Filed at 10/07/2020 0801     This score is the patient's risk of an unplanned readmission within 30 days of being discharged (0 -100%). The score is based on dignosis, age, lab data, medications, orders, and past utilization.   Low:  0-14.9   Medium: 15-21.9   High: 22-29.9   Extreme: 30 and above        Discharge Instructions:  Allergies as of 10/07/2020      Reactions   Elemental Sulfur    Sulfa Antibiotics       Medication List    STOP taking these medications  hydrOXYzine 25 MG tablet Commonly known as: ATARAX/VISTARIL   sertraline 100 MG tablet Commonly known as: ZOLOFT     TAKE these medications   famotidine 20 MG tablet Commonly known as: PEPCID Take 1 tablet (20 mg total) by mouth 2 (two) times daily for 10 days.   hydroxychloroquine 200 MG tablet Commonly known as: PLAQUENIL Take 1 tablet by mouth 2 (two) times daily.   hydrOXYzine 25 MG capsule Commonly known as: VISTARIL Take 1 capsule (25 mg total) by mouth 3  (three) times daily as needed for up to 10 days for itching.   medroxyPROGESTERone 150 MG/ML injection Commonly known as: DEPO-PROVERA Inject into the muscle.   naproxen sodium 220 MG tablet Commonly known as: ALEVE Take 1 tablet (220 mg total) by mouth daily as needed. What changed:   how much to take  when to take this  reasons to take this   predniSONE 20 MG tablet Commonly known as: DELTASONE Take 2 tablets (40 mg total) by mouth daily for 4 days. What changed: medication strength        Follow-up Information    Charlton Amor, MD. Schedule an appointment as soon as possible for a visit in 1 week(s).   Specialty: Pediatrics Contact information: [address] [place] [address] [phone]        Patterson Hammersmith, MD. Schedule an appointment as soon as possible for a visit in 1 week(s).   Specialty: Rheumatology Contact information: [address] [place] [address] [phone]               Allergies  Allergen Reactions  . Elemental Sulfur   . Sulfa Antibiotics      The results of significant diagnostics from this hospitalization (including imaging, microbiology, ancillary and laboratory) are listed below for reference.   Consultations:   Procedures/Studies: DG Chest 2 View  Result Date: 10/06/2020 CLINICAL DATA:  Needed for nuclear med test EXAM: CHEST - 2 VIEW COMPARISON:  Chest radiograph or [id number] FINDINGS: The cardiomediastinal contours are within normal limits. The lungs are clear. No pneumothorax or pleural effusion. No acute finding in the visualized skeleton. IMPRESSION: No acute cardiopulmonary process. Electronically Signed   By: Emmaline Kluver M.D.   On: 10/06/2020 11:44   NM Pulmonary Perfusion  Result Date: 10/06/2020 CLINICAL DATA:  Shortness of breath. Elevated D-dimer. History of allergic reaction. EXAM: NUCLEAR MEDICINE PERFUSION LUNG SCAN TECHNIQUE: Perfusion images were obtained in multiple projections  after intravenous injection of radiopharmaceutical. Ventilation scans intentionally deferred if perfusion scan and chest x-ray adequate for interpretation during COVID 19 epidemic. RADIOPHARMACEUTICALS:  3.6 mCi Tc-85m MAA IV COMPARISON:  Chest x-ray 10/06/2020 FINDINGS: There is normal bilateral perfusion. No evidence for wedge-shaped, pleural-based defect to indicate pulmonary embolus. IMPRESSION: Normal perfusion. Electronically Signed   By: Norva Pavlov M.D.   On: 10/06/2020 13:11      Labs: BNP (last 3 results) No results for input(s): BNP in the last 8760 hours. Basic Metabolic Panel: Recent Labs  Lab 10/05/20 1503 10/06/20 0554 10/07/20 0428  NA 141 141 140  K 3.9 4.1 4.1  CL 110 110 108  CO2 21* 24 25  GLUCOSE 127* 135* 129*  BUN 14 15 16   CREATININE 0.66 0.67 0.54  CALCIUM 9.1 8.4* 8.3*  MG  --   --  2.3   Liver Function Tests: Recent Labs  Lab 10/05/20 1503 10/06/20 0554  AST 23 15  ALT 18 13  ALKPHOS 45 35*  BILITOT 1.0 0.6  PROT 6.9 6.0*  ALBUMIN 4.1 3.4*   Recent Labs  Lab 10/05/20 1659  LIPASE 22   No results for input(s): AMMONIA in the last 168 hours. CBC: Recent Labs  Lab 10/05/20 1503 10/05/20 1639 10/06/20 0554 10/07/20 0428  WBC 19.1* 13.3* 12.0* 13.5*  NEUTROABS  --  12.4*  --   --   HGB 13.9 13.0 12.3 12.1  HCT 42.0 38.3 36.9 36.4  MCV 88.6 88.2 88.3 87.3  PLT 303 274 248 257   Cardiac Enzymes: No results for input(s): CKTOTAL, CKMB, CKMBINDEX, TROPONINI in the last 168 hours. BNP: Invalid input(s): POCBNP CBG: No results for input(s): GLUCAP in the last 168 hours. D-Dimer Recent Labs    10/05/20 1639  DDIMER 5.75*   Hgb A1c Recent Labs    10/05/20 1639  HGBA1C 5.5   Lipid Profile No results for input(s): CHOL, HDL, LDLCALC, TRIG, CHOLHDL, LDLDIRECT in the last 72 hours. Thyroid function studies Recent Labs    10/05/20 1639  TSH 0.199*   Anemia work up No results for input(s): VITAMINB12, FOLATE, FERRITIN,  TIBC, IRON, RETICCTPCT in the last 72 hours. Urinalysis No results found for: COLORURINE, APPEARANCEUR, LABSPEC, PHURINE, GLUCOSEU, HGBUR, BILIRUBINUR, KETONESUR, PROTEINUR, UROBILINOGEN, NITRITE, LEUKOCYTESUR Sepsis Labs Invalid input(s): PROCALCITONIN,  WBC,  LACTICIDVEN Microbiology Recent Results (from the past 240 hour(s))  SARS CORONAVIRUS 2 (TAT 6-24 HRS) Nasopharyngeal Nasopharyngeal Swab     Status: None   Collection Time: 10/05/20  3:48 PM   Specimen: Nasopharyngeal Swab  Result Value Ref Range Status   SARS Coronavirus 2 NEGATIVE NEGATIVE Final    Comment: (NOTE) SARS-CoV-2 target nucleic acids are NOT DETECTED.  The SARS-CoV-2 RNA is generally detectable in upper and lower respiratory specimens during the acute phase of infection. Negative results do not preclude SARS-CoV-2 infection, do not rule out co-infections with other pathogens, and should not be used as the sole basis for treatment or other patient management decisions. Negative results must be combined with clinical observations, patient history, and epidemiological information. The expected result is Negative.  Fact Sheet for Patients: HairSlick.no  Fact Sheet for Healthcare Providers: quierodirigir.com  This test is not yet approved or cleared by the [place] FDA and  has been authorized for detection and/or diagnosis of SARS-CoV-2 by FDA under an Emergency Use Authorization (EUA). This EUA will remain  in effect (meaning this test can be used) for the duration of the COVID-19 declaration under Se ction 564(b)(1) of the Act, 21 U.S.C. section 360bbb-3(b)(1), unless the authorization is terminated or revoked sooner.  Performed at [facility], [address] [place], [place] [address]      Total time spend on discharging this patient, including the last patient exam, discussing the hospital stay, instructions for ongoing care as it  relates to all pertinent caregivers, as well as preparing the medical discharge records, prescriptions, and/or referrals as applicable, is 45 minutes.    Darlin Priestly, MD  [facility] 10/07/2020, 10:59 AM

## 2020-10-14 LAB — LYME DISEASE, WESTERN BLOT
IgG P18 Ab.: ABSENT
IgG P23 Ab.: ABSENT
IgG P28 Ab.: ABSENT
IgG P30 Ab.: ABSENT
IgG P39 Ab.: ABSENT
IgG P41 Ab.: ABSENT
IgG P45 Ab.: ABSENT
IgG P58 Ab.: ABSENT
IgG P66 Ab.: ABSENT
IgG P93 Ab.: ABSENT
IgM P23 Ab.: ABSENT
IgM P39 Ab.: ABSENT
IgM P41 Ab.: ABSENT
Lyme IgG Wb: NEGATIVE
Lyme IgM Wb: NEGATIVE

## 2022-02-02 IMAGING — NM NM PULMONARY PERF PARTICULATE
1 series · 8 of 8 positions shown · non-contrast
Comparison: Chest x-ray 10/06/2020

CLINICAL DATA: Shortness of breath. Elevated D-dimer. History of
allergic reaction.

EXAM:
NUCLEAR MEDICINE PERFUSION LUNG SCAN
TECHNIQUE: Perfusion images were obtained in multiple projections after
intravenous injection of radiopharmaceutical.
Ventilation scans intentionally deferred if perfusion scan and chest
x-ray adequate for interpretation during COVID 19 epidemic.
RADIOPHARMACEUTICALS:  3.6 mCi Sc-99m MAA IV

[Series 1000: lung perfusion · 1.65mm/px · 4 acquisitions, 8 frames shown]
[im 1/4]
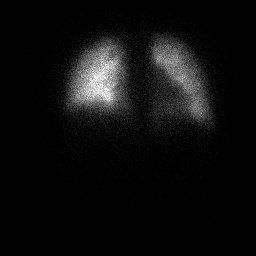
[im 1/4]
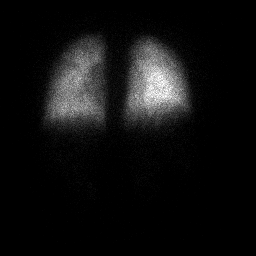
[im 2/4]
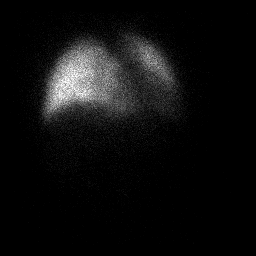
[im 2/4]
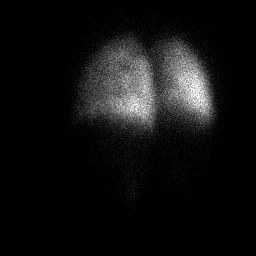
[im 3/4]
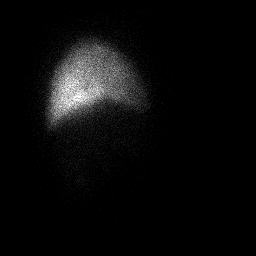
[im 3/4]
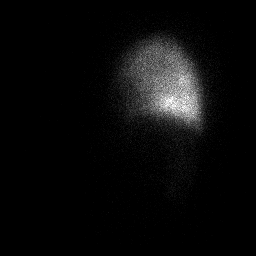
[im 4/4]
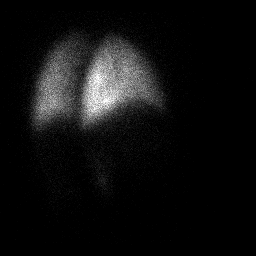
[im 4/4]
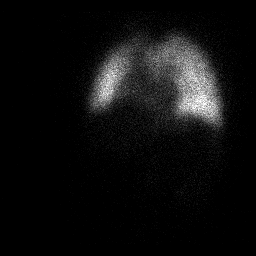

[8 of 8 positions shown; findings below may reference images not displayed]

FINDINGS: There is normal bilateral perfusion. No evidence for wedge-shaped,
pleural-based defect to indicate pulmonary embolus.
IMPRESSION: Normal perfusion.
